# Patient Record
Sex: Female | Born: 1995 | Race: White | Hispanic: No | Marital: Married | Smoking: Never smoker
Health system: Southern US, Community
[De-identification: ages and names within clinical notes are randomized; demographics above are authoritative.]

## PROBLEM LIST (undated history)

## (undated) DIAGNOSIS — Z789 Other specified health status: Secondary | ICD-10-CM

---
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
## 2014-07-05 ENCOUNTER — Emergency Department
Admit: 2014-07-05 | Disposition: A | Discharge status: Home / Self Care | Payer: Self-pay | Admitting: Physician Assistant

## 2014-10-31 ENCOUNTER — Other Ambulatory Visit: Payer: Self-pay | Admitting: Nurse Practitioner

## 2014-10-31 DIAGNOSIS — S5402XA Injury of ulnar nerve at forearm level, left arm, initial encounter: Secondary | ICD-10-CM

## 2014-11-05 ENCOUNTER — Ambulatory Visit: Payer: Self-pay

## 2014-11-08 ENCOUNTER — Ambulatory Visit
Admission: RE | Admit: 2014-11-08 | Discharge: 2014-11-08 | Disposition: A | Discharge status: Home / Self Care | Payer: BLUE CROSS/BLUE SHIELD | Source: Ambulatory Visit | Attending: Nurse Practitioner | Admitting: Nurse Practitioner

## 2014-11-08 DIAGNOSIS — R202 Paresthesia of skin: Secondary | ICD-10-CM | POA: Insufficient documentation

## 2014-11-08 DIAGNOSIS — M25522 Pain in left elbow: Secondary | ICD-10-CM | POA: Insufficient documentation

## 2014-11-08 DIAGNOSIS — S5402XA Injury of ulnar nerve at forearm level, left arm, initial encounter: Secondary | ICD-10-CM

## 2015-05-29 ENCOUNTER — Ambulatory Visit
Admission: RE | Admit: 2015-05-29 | Discharge: 2015-05-29 | Disposition: A | Discharge status: Home / Self Care | Payer: BLUE CROSS/BLUE SHIELD | Source: Ambulatory Visit | Attending: Nurse Practitioner | Admitting: Nurse Practitioner

## 2015-05-29 ENCOUNTER — Other Ambulatory Visit: Payer: Self-pay | Admitting: Nurse Practitioner

## 2015-05-29 DIAGNOSIS — R918 Other nonspecific abnormal finding of lung field: Secondary | ICD-10-CM | POA: Diagnosis not present

## 2015-05-29 DIAGNOSIS — R05 Cough: Secondary | ICD-10-CM

## 2015-05-29 DIAGNOSIS — R059 Cough, unspecified: Secondary | ICD-10-CM

## 2016-01-26 ENCOUNTER — Ambulatory Visit
Admission: EM | Admit: 2016-01-26 | Discharge: 2016-01-26 | Disposition: A | Discharge status: Home / Self Care | Payer: BLUE CROSS/BLUE SHIELD | Attending: Family Medicine | Admitting: Family Medicine

## 2016-01-26 ENCOUNTER — Encounter: Payer: Self-pay | Admitting: *Deleted

## 2016-01-26 DIAGNOSIS — R509 Fever, unspecified: Secondary | ICD-10-CM

## 2016-01-26 DIAGNOSIS — B2790 Infectious mononucleosis, unspecified without complication: Secondary | ICD-10-CM | POA: Diagnosis not present

## 2016-01-26 DIAGNOSIS — D72829 Elevated white blood cell count, unspecified: Secondary | ICD-10-CM

## 2016-01-26 LAB — CBC WITH DIFFERENTIAL/PLATELET
BASOS ABS: 0 10*3/uL (ref 0–0.1)
Basophils Relative: 0 %
EOS ABS: 0 10*3/uL (ref 0–0.7)
Eosinophils Relative: 0 %
HCT: 39 % (ref 35.0–47.0)
Hemoglobin: 13.1 g/dL (ref 12.0–16.0)
LYMPHS ABS: 15.4 10*3/uL — AB (ref 1.0–3.6)
Lymphocytes Relative: 72 %
MCH: 29.3 pg (ref 26.0–34.0)
MCHC: 33.6 g/dL (ref 32.0–36.0)
MCV: 87.3 fL (ref 80.0–100.0)
MONO ABS: 0.9 10*3/uL (ref 0.2–0.9)
MONOS PCT: 4 %
Neutro Abs: 5.1 10*3/uL (ref 1.4–6.5)
Neutrophils Relative %: 24 %
PLATELETS: 183 10*3/uL (ref 150–440)
RBC: 4.46 MIL/uL (ref 3.80–5.20)
RDW: 13.2 % (ref 11.5–14.5)
WBC: 21.4 10*3/uL — AB (ref 3.6–11.0)

## 2016-01-26 LAB — BASIC METABOLIC PANEL
ANION GAP: 12 (ref 5–15)
BUN: 9 mg/dL (ref 6–20)
CO2: 22 mmol/L (ref 22–32)
Calcium: 8.9 mg/dL (ref 8.9–10.3)
Chloride: 98 mmol/L — ABNORMAL LOW (ref 101–111)
Creatinine, Ser: 0.84 mg/dL (ref 0.44–1.00)
Glucose, Bld: 134 mg/dL — ABNORMAL HIGH (ref 65–99)
POTASSIUM: 3.5 mmol/L (ref 3.5–5.1)
SODIUM: 132 mmol/L — AB (ref 135–145)

## 2016-01-26 LAB — MONONUCLEOSIS SCREEN: MONO SCREEN: POSITIVE — AB

## 2016-01-26 LAB — RAPID STREP SCREEN (MED CTR MEBANE ONLY): Streptococcus, Group A Screen (Direct): NEGATIVE

## 2016-01-26 MED ORDER — DEXAMETHASONE SODIUM PHOSPHATE 10 MG/ML IJ SOLN
10.0000 mg | Freq: Once | INTRAMUSCULAR | Status: AC
  Administered 2016-01-26: 10 mg via INTRAMUSCULAR

## 2016-01-26 MED ORDER — ACETAMINOPHEN 160 MG/5ML PO SOLN
960.0000 mg | Freq: Once | ORAL | Status: AC
  Administered 2016-01-26: 960 mg via ORAL

## 2016-01-26 NOTE — ED Notes (Signed)
Pt refused strep screen

## 2016-01-26 NOTE — ED Triage Notes (Signed)
Sore throat, fever, runny nose x1 week. Dysphagia due to pain and throat edema. Mother states fever as high as 104 today. Seen by PCP Thursday and tested negeative for strep but was tx for it. Pt could not swallow rx pill so was switched to liquid Friday. Pt not improving.

## 2016-01-26 NOTE — Discharge Instructions (Signed)
Rest. Drink plenty of fluids. Take over-the-counter Tylenol or ibuprofen as needed for control pain and fever.  Follow up with your primary care physician tomorrow. Return to Urgent care or ER for new or worsening concerns.

## 2016-01-26 NOTE — ED Provider Notes (Signed)
[facility] ____________________________________________  Time seen: Approximately 9:22 PM  I have reviewed the triage vital signs and the nursing notes.   HISTORY  Chief Complaint Sore Throat; Fever; Nasal Congestion; and Otalgia   HPI Joy Barrett is a 20 y.o. female presents with mother at bedside for the complaints of sore throat and fever. Patient mother reports that last Wednesday and Thursday patient began having sore throat and mild fevers. Reports that patient was seen by her primary care physician and started on oral penicillin. Reports unable to tolerate pills and then called PCP back on Friday and was switched to liquid penicillin. Mother states that initially she was given half the dose by mistake for 1.5 days, but reports has been given full dose of antibiotics for the last 1.5 days. Reports has continued with sore throat, tonsillar swelling and intermittent fevers. Patient states that also today her throat no longer hurts but continues with swelling in her throat. Denies sore throat at this time. Reports has not tried to take any over-the-counter Tylenol or ibuprofen since this morning. Has not tried to take liquid antipyretics at home. Reports has been taken home antibiotics.   Patient reports multiple sick contacts at school including recent strep contacts. Denies recent sickness. Denies recent history of mono. Denies any accompanying nasal congestion, runny nose, cough, chest pain, shortness breath, abdominal pain, dysuria, neck pain, back pain, or rash.   Patient's last menstrual period was 12/29/2015 (approximate). Denies pregnancy.    History reviewed. No pertinent past medical history.  There are no active problems to display for this patient.   History reviewed. No pertinent surgical history.  Current Outpatient Rx  . Order #: [id number] Class: Historical Med    No current facility-administered medications for this encounter.   Current  Outpatient Prescriptions:  .  penicillin v potassium (VEETID) 250 MG/5ML solution, Take by mouth 4 (four) times daily., Disp: , Rfl:   Allergies Albuterol  History reviewed. No pertinent family history.  Social History Social History  Substance Use Topics  . Smoking status: Never Smoker  . Smokeless tobacco: Never Used  . Alcohol use No    Review of Systems Constitutional: Positive fever. Eyes: No visual changes. ENT: Positive sore throat. Cardiovascular: Denies chest pain. Respiratory: Denies shortness of breath. Gastrointestinal: No abdominal pain.  No nausea, no vomiting.  No diarrhea.  No constipation. Genitourinary: Negative for dysuria. Musculoskeletal: Negative for back pain. Skin: Negative for rash. Neurological: Negative for headaches, focal weakness or numbness.  10-point ROS otherwise negative.  ____________________________________________   PHYSICAL EXAM:  VITAL SIGNS: ED Triage Vitals  Enc Vitals Group     BP 01/26/16 1856 115/60     Pulse Rate 01/26/16 1856 (!) 132     Resp 01/26/16 1856 20     Temp 01/26/16 1856 (!) 103 F (39.4 C)     Temp Source 01/26/16 1856 Oral     SpO2 01/26/16 1856 98 %     Weight --      Height 01/26/16 1900 5\' 3"  (1.6 m)     Head Circumference --      Peak Flow --      Pain Score --      Pain Loc --      Pain Edu? --      Excl. in GC? --    Today's Vitals   01/26/16 1856 01/26/16 1900 01/26/16 2018  BP: 115/60  110/66  Pulse: (!) 132  (!) 124  Resp: 20  16  Temp: (!) 103 F (39.4 C)  99.6 F (37.6 C)  TempSrc: Oral    SpO2: 98%  98%  Height:  5\' 3"  (1.6 m)    Constitutional: Alert and oriented. Well appearing and in no acute distress. Eyes: Conjunctivae are normal. PERRL. EOMI. Head: Atraumatic. No sinus tenderness to palpation. No swelling. No erythema.  Ears: no erythema, normal TMs bilaterally.   Nose:No nasal congestion or rhinorrhea.   Mouth/Throat: Mucous membranes are moist. Moderate pharyngeal  erythema with 4+ bilateral tonsillar swelling and exudate. Unable to visualize if uvular shift is present. No obvious unilateral abscess. Neck: No stridor.  No cervical spine tenderness to palpation. Hematological/Lymphatic/Immunilogical: bilateral anterior cervical lymphadenopathy. Cardiovascular: Normal rate, regular rhythm. Grossly normal heart sounds.  Good peripheral circulation. Respiratory: Normal respiratory effort.  No retractions. Lungs CTAB.No wheezes, rales or rhonchi. Good air movement.  Gastrointestinal: Soft and nontender. Normal Bowel sounds. No CVA tenderness. No hepatosplenomegaly palpated. Musculoskeletal: No lower or upper extremity tenderness nor edema. No cervical, thoracic or lumbar tenderness to palpation. Neurologic:  Normal speech and language. No gross focal neurologic deficits are appreciated. No gait instability. Skin:  Skin is warm, dry and intact. No rash noted. Psychiatric: Mood and affect are normal. Speech and behavior are normal.  ___________________________________________   LABS (all labs ordered are listed, but only abnormal results are displayed)  Labs Reviewed  CBC WITH DIFFERENTIAL/PLATELET - Abnormal; Notable for the following:       Result Value   WBC 21.4 (*)    Lymphs Abs 15.4 (*)    All other components within normal limits  BASIC METABOLIC PANEL - Abnormal; Notable for the following:    Sodium 132 (*)    Chloride 98 (*)    Glucose, Bld 134 (*)    All other components within normal limits  MONONUCLEOSIS SCREEN - Abnormal; Notable for the following:    Mono Screen POSITIVE (*)    All other components within normal limits  RAPID STREP SCREEN (NOT AT [facility])  CULTURE, GROUP A STREP [facility]    RADIOLOGY  No results found. ____________________________________________   PROCEDURES Procedures    INITIAL IMPRESSION / ASSESSMENT AND PLAN / ED COURSE  Pertinent labs & imaging results that were available during my care of the patient were  reviewed by me and considered in my medical decision making (see chart for details).  Patient presenting with mother at bedside for sore throat and fever. Patient is noted to have 4+ out of tonsillar swelling and bilateral exudate. Unable to determine if uvular shift present or not. Patient able to tolerate fluids in room. 1 g Tylenol orally given once. Discussed with patient evaluation of strep, mono, CBC and WBC. Discussed in detail with patient and mother concerning regarding tonsillar abscess possibility and based on nonimprovement with oral antibiotics with continued fever, recommend patient be seen in further evaluated emergency room. Patient mother states he won't have laboratory studies performed here.  Patient after Decadron 10 mg IM as well as Tylenol, patient reports feeling much better injured and fluids better in urgent care. Fever improved, remained tachycardic. Patient appears stable. Discussed in detail laboratory results. Patient with marked elevation of WBC of 21.4 and positive for mono. Patient initially refused strep, but then did agree and quick strep negative, will culture. As Mono positive and strep negative with no clear improvement with oral antibiotics at home, will stop oral antibiotics at this time. In discussing with patient and mother recommended for further evaluation and  exclusion of tonsillar abscess in ER, patient mother refused. Patient mother verbalized understanding of risks including up to death of airway extraction, and states that they will go home with strict follow-up and return parameters. Discussed in detail with patient and mother to follow-up with primary care physician tomorrow for follow-up and monitoring of WBC count. Discussed no contact sports for at least 4 weeks. Encouraged supportive care and should follow-up and return parameters to urgent care or ER.  Discussed follow up with Primary care physician this week. Discussed follow up and return parameters  including no resolution or any worsening concerns. Patient verbalized understanding and agreed to plan.   ____________________________________________   FINAL CLINICAL IMPRESSION(S) / ED DIAGNOSES  Final diagnoses:  Mononucleosis  Leukocytosis, unspecified type  Fever, unspecified fever cause     Discharge Medication List as of 01/26/2016  8:58 PM      Note: This dictation was prepared with Dragon dictation along with smaller phrase technology. Any transcriptional errors that result from this process are unintentional.    Clinical Course       Renford Dills, NP 01/26/16 2133

## 2016-01-29 LAB — CULTURE, GROUP A STREP (THRC)

## 2016-01-30 ENCOUNTER — Telehealth: Payer: Self-pay | Admitting: Emergency Medicine

## 2016-01-30 MED ORDER — AZITHROMYCIN 200 MG/5ML PO SUSR: ORAL | 0 refills | Status: DC

## 2016-01-30 NOTE — Telephone Encounter (Signed)
Mother called stating that her daughter was no longer taking Penicillin because her daughter was diagnosed with Mono during her visit on 11/3.  Mother wanted to know if her daughter should be put on another antibiotic given her throat culture result.  Mother states that her daughter had 5 days of Penicillin prior to her visit on 11/13.  Spoke to ArvinMeritorBill Roemer, [place] who reviewed patient's throat culture result and feels that she was treated adequately for a subgroup of Strep for 5 days and that no other antibiotic is needed at this time.  Tried calling patient and mother- no answer and left message for them to give us a call back.

## 2016-01-30 NOTE — Telephone Encounter (Signed)
Patient notified of her throat culture result and to continue with her Penicillin.  Patient verbalized understanding.

## 2016-01-30 NOTE — Telephone Encounter (Signed)
Spoke to patient's mother who states that her daughter is still having a sore throat and has only had a little improvement since her visit on 11/13 and has [facility] after her visit on 11/13 for Decadron injections and mother also states that her daughter was placed on Prednisone to take at home.  I spoke to Dr. Thurmond ButtsWade who was informed of patient's ongoing symptoms and after reviewing patient's chart recommended Azithromycin 200mg /75ml 2 1/2 tsp once a day for 3 days no refills sent to patient's pharmacy.  Mother was notified that the prescription would be sent to [facility] in [place].  Mother was also instructed to monitor her daughter closely to make sure that she is taking in enough fluids and urinating and is not having any difficulty with swallowing.  Mother was advised that is she was having any difficulties with these or if her symptoms worsen that she would need to go immediately to the ER.  Mother was also informed that her daughter would need to follow-up with her PCP in 2 weeks to be retested for strep and to have her WBC rechecked. Mother verbalized understanding.  Mother states that her daughter has [facility] on Tuesday, [facility].  Mother to call us anytime if she has any questions or concerns.

## 2016-02-01 ENCOUNTER — Telehealth: Payer: Self-pay | Admitting: Emergency Medicine

## 2016-02-01 NOTE — Telephone Encounter (Signed)
Tried calling patient- no answer.  Left message for patient to call us back if her symptoms were not improving or worsening.

## 2016-09-24 IMAGING — CR DG CHEST 2V
1 series · 2 of 2 positions shown · non-contrast
Comparison: None.

CLINICAL DATA: Nonproductive cough for 3 weeks.

EXAM:
CHEST  2 VIEW

[Series 1: dg chest 2 view · 0.14mm/px · 2 of 2 slices shown]
[im 1/2]
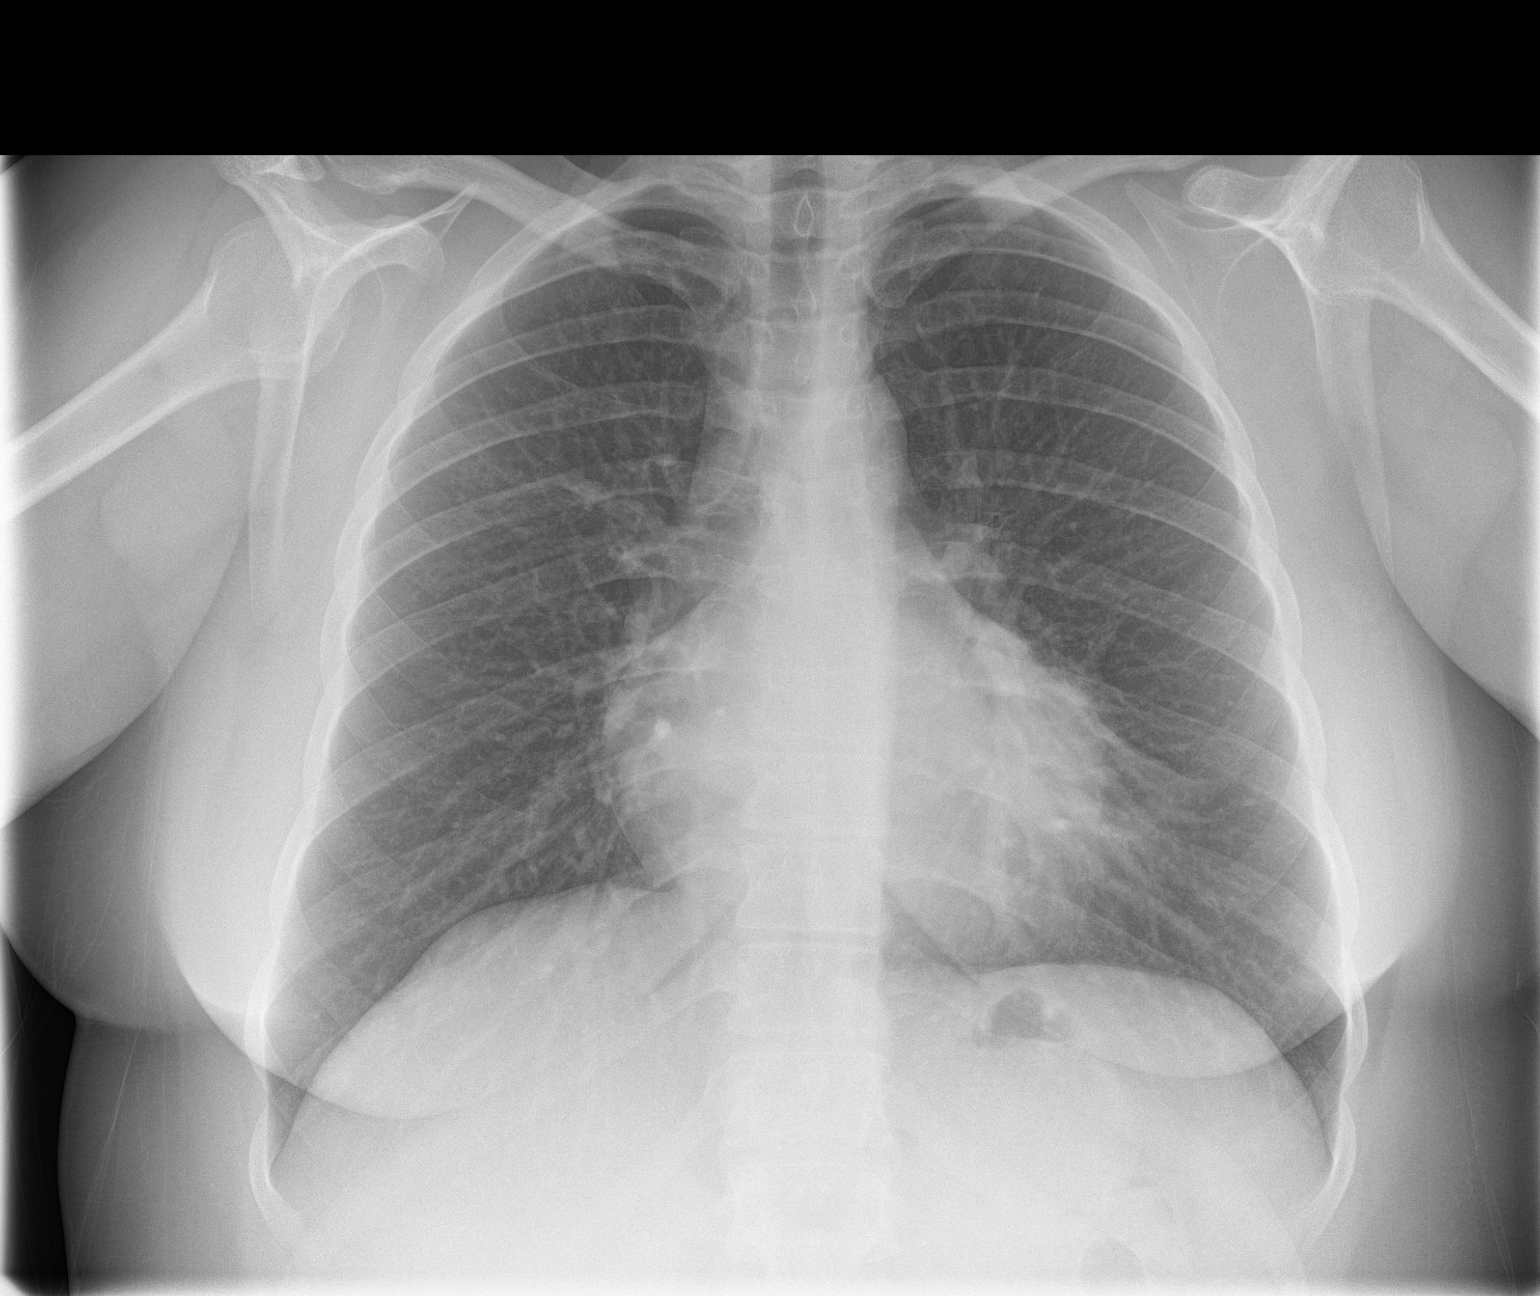
[im 2/2]
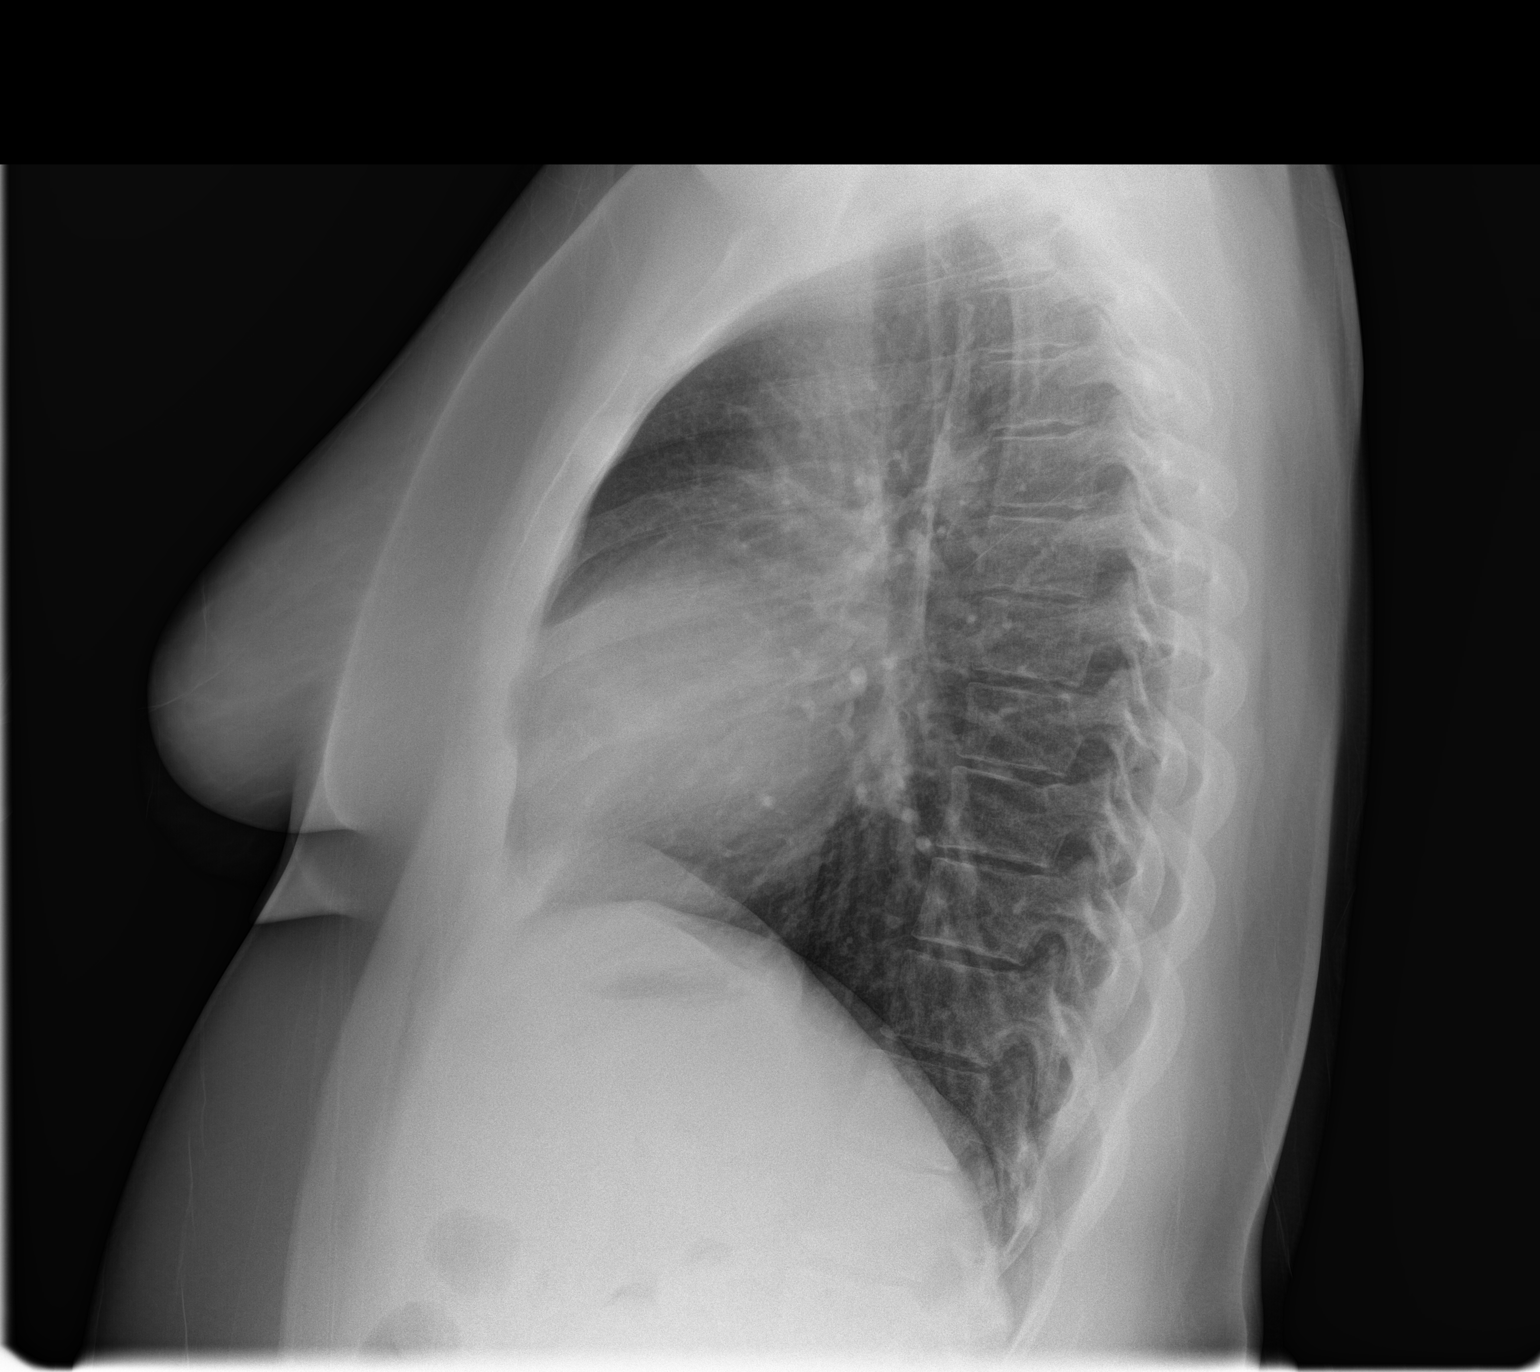

[2 of 2 positions shown; findings below may reference images not displayed]

FINDINGS: Ill-defined airspace opacity overlies the left heart border, highly
suspicious for a lingular pneumonia. Lungs otherwise clear. Heart
size is normal.
IMPRESSION: Probable lingular pneumonia.

## 2017-04-29 ENCOUNTER — Ambulatory Visit
Admission: EM | Admit: 2017-04-29 | Discharge: 2017-04-29 | Disposition: A | Discharge status: Home / Self Care | Payer: BLUE CROSS/BLUE SHIELD | Attending: Emergency Medicine | Admitting: Emergency Medicine

## 2017-04-29 ENCOUNTER — Encounter: Payer: Self-pay | Admitting: Emergency Medicine

## 2017-04-29 ENCOUNTER — Other Ambulatory Visit: Payer: Self-pay

## 2017-04-29 DIAGNOSIS — J0100 Acute maxillary sinusitis, unspecified: Secondary | ICD-10-CM | POA: Diagnosis not present

## 2017-04-29 HISTORY — DX: Other specified health status: Z78.9

## 2017-04-29 LAB — RAPID INFLUENZA A&B ANTIGENS (ARMC ONLY)
INFLUENZA A (ARMC): NEGATIVE
INFLUENZA B (ARMC): NEGATIVE

## 2017-04-29 MED ORDER — FLUTICASONE PROPIONATE 50 MCG/ACT NA SUSP: 2.0000 | Freq: Every day | NASAL | 0 refills | Status: DC

## 2017-04-29 MED ORDER — AMOXICILLIN-POT CLAVULANATE 875-125 MG PO TABS: 1.0000 | ORAL_TABLET | Freq: Two times a day (BID) | ORAL | 0 refills | Status: DC

## 2017-04-29 MED ORDER — IBUPROFEN 600 MG PO TABS: 600.0000 mg | ORAL_TABLET | Freq: Four times a day (QID) | ORAL | 0 refills | Status: DC | PRN

## 2017-04-29 NOTE — ED Triage Notes (Signed)
Patient in today c/o weak, dizziness and sweating 2 days ago. Now with nasal congestion, runny nose and headache.

## 2017-04-29 NOTE — Discharge Instructions (Signed)
Take the medication as written. Start Mucinex-D to keep the mucous thin and to decongest you.  You may take 600 mg of motrin with 1 gram of tylenol up to 3-4 times a day as needed for pain. This is an effective combination for pain.  Most sinus infections are viral and do not need antibiotics unless you have a high fever, have had this for 10 days, or you get better and then get sick again. Use a NeilMed sinus rinse as often as you want to to reduce nasal congestion. Follow the directions on the box.   Go to www.goodrx.com to look up your medications. This will give you a list of where you can find your prescriptions at the most affordable prices. Or you can ask the pharmacist what the cash price is. This is frequently cheaper than going through insurance.   

## 2017-04-29 NOTE — ED Provider Notes (Signed)
HPI  SUBJECTIVE:  Joy Barrett is a 22 y.o. female who presents with greenish then clear nasal congestion, rhinorrhea, postnasal drip, diffuse headache starting last night.  She reports sinus pain and pressure, bilateral ear pressure.  She states that she was dizzy with nausea and sweats 2 days ago, but this has since resolved.  She denies any documented fevers but states that she does not "trust her thermometer".  She reports body aches, headaches, and allergy symptoms.  No upper dental pain, facial swelling, coughing, wheezing, chest pain, shortness of breath.  She tried Sudafed, Airborne, Goody's powder.  The Goody's helped with her headache.  No aggravating factors.  She was treated for pneumonia last month with azithromycin.  She has not taken any antipyretics in the past 6-8 hours.  She got a flu shot this year.  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  No history of diabetes, hypertension.  LMP: Now.  PMD: Dr. Carter KittenHeather Bustia    Past Medical History:  Diagnosis Date  . No known health problems     History reviewed. No pertinent surgical history.  Family History  Problem Relation Age of Onset  . Rheum arthritis Mother   . Colon polyps Father     Social History   Tobacco Use  . Smoking status: Never Smoker  . Smokeless tobacco: Never Used  Substance Use Topics  . Alcohol use: No  . Drug use: No    No current facility-administered medications for this encounter.   Current Outpatient Medications:  .  amoxicillin-clavulanate (AUGMENTIN) 875-125 MG tablet, Take 1 tablet by mouth 2 (two) times daily. X 7 days, Disp: 14 tablet, Rfl: 0 .  fluticasone (FLONASE) 50 MCG/ACT nasal spray, Place 2 sprays into both nostrils daily., Disp: 16 g, Rfl: 0 .  ibuprofen (ADVIL,MOTRIN) 600 MG tablet, Take 1 tablet (600 mg total) by mouth every 6 (six) hours as needed., Disp: 30 tablet, Rfl: 0  Allergies  Allergen Reactions  . Albuterol  Hives     ROS  As noted in HPI.   Physical Exam  BP (!) 97/57 (BP Location: Left Arm)   Pulse 89   Temp 98.7 F (37.1 C) (Oral)   Resp 16   Ht 5\' 2"  (1.575 m)   Wt 180 lb (81.6 kg)   LMP 04/29/2017 (Exact Date)   SpO2 100%   BMI 32.92 kg/m   Constitutional: Well developed, well nourished, no acute distress Eyes:  EOMI, conjunctiva normal bilaterally HENT: Normocephalic, atraumatic,mucus membranes moist.  TMs normal bilaterally.  Erythematous, swollen turbinates.  Clear nasal congestion.  Mild maxillary sinus tenderness.  No frontal sinus tenderness.  Normal oropharynx.  Positive postnasal drip with cobblestoning. Respiratory: Normal inspiratory effort Cardiovascular: Normal rate GI: nondistended skin: No rash, skin intact Musculoskeletal: no deformities Neurologic: Alert & oriented x 3, no focal neuro deficits Psychiatric: Speech and behavior appropriate   ED Course   Medications - No data to display  Orders Placed This Encounter  Procedures  . Rapid Influenza A&B Antigens ([facility] only)    Standing Status:   Standing    Number of Occurrences:   1  . Droplet precaution    Standing Status:   Standing    Number of Occurrences:   1    Results for orders placed or performed during the hospital encounter of 04/29/17 (from the past 24 hour(s))  Rapid Influenza A&B Antigens ([facility] only)     Status: None  Collection Time: 04/29/17  2:18 PM  Result Value Ref Range   Influenza A ([facility]) NEGATIVE NEGATIVE   Influenza B ([facility]) NEGATIVE NEGATIVE   No results found.  ED Clinical Impression  Acute non-recurrent maxillary sinusitis   ED Assessment/Plan  Will check for flu although her presentation is more consistent with a sinusitis, most likely viral.  Discussed this with patient and parent.  Will send home with ibuprofen 600 mg to take with 1 g of Tylenol 3-4 times a day as needed for pain, Flonase, saline nasal irrigation, Mucinex D.  Will send home with a wait-and-see  prescription of Augmentin, but advised him to not fill it until she has been sick for 10 days or if she starts having fevers above 102.  She agrees with plan.  Flu negative.  Plan as above.  Meds ordered this encounter  Medications  . fluticasone (FLONASE) 50 MCG/ACT nasal spray    Sig: Place 2 sprays into both nostrils daily.    Dispense:  16 g    Refill:  0  . amoxicillin-clavulanate (AUGMENTIN) 875-125 MG tablet    Sig: Take 1 tablet by mouth 2 (two) times daily. X 7 days    Dispense:  14 tablet    Refill:  0  . ibuprofen (ADVIL,MOTRIN) 600 MG tablet    Sig: Take 1 tablet (600 mg total) by mouth every 6 (six) hours as needed.    Dispense:  30 tablet    Refill:  0    *This clinic note was created using Scientist, clinical (histocompatibility and immunogenetics). Therefore, there may be occasional mistakes despite careful proofreading.   ?   Domenick Gong, MD 04/29/17 [phone]

## 2017-06-27 ENCOUNTER — Encounter: Payer: Self-pay | Admitting: Nurse Practitioner

## 2017-06-27 ENCOUNTER — Ambulatory Visit: Payer: BLUE CROSS/BLUE SHIELD | Admitting: Nurse Practitioner

## 2017-06-27 VITALS — BP 123/59 | HR 88 | Resp 16 | Ht 62.5 in | Wt 223.0 lb

## 2017-06-27 DIAGNOSIS — K580 Irritable bowel syndrome with diarrhea: Secondary | ICD-10-CM

## 2017-06-27 DIAGNOSIS — F321 Major depressive disorder, single episode, moderate: Secondary | ICD-10-CM

## 2017-06-27 MED ORDER — HYOSCYAMINE SULFATE 0.125 MG PO TABS: 0.2500 mg | ORAL_TABLET | ORAL | 2 refills | Status: AC | PRN

## 2017-06-27 MED ORDER — IMIPRAMINE HCL 10 MG PO TABS: 10.0000 mg | ORAL_TABLET | Freq: Every day | ORAL | 3 refills | Status: DC

## 2017-06-27 NOTE — Progress Notes (Signed)
[facility] [address] [place], [place] [address]  Internal MEDICINE  Office Visit Note  Patient Name: Joy Barrett  [id number]  [id number]  Date of Service: 07/17/2017   Pt is here for routine follow up.   Chief Complaint  Patient presents with  . Irritable Bowel Syndrome    diarrhea    The patient is complaining of irritable bowel syndrome. She has frequent cramping and episodes of diarrhea. Become more frequent and less predictable while stressed. Previously prescribed Bentyl is not really helping. She has had a great deal of increased anxiety since her parent have separated. She would like to adopt a cat to help her with her anxiety and depression.       Current Medication: Outpatient Encounter Medications as of 06/27/2017  Medication Sig  . cetirizine (ZYRTEC) 10 MG tablet Take 10 mg by mouth daily.  . Norgestimate-Ethinyl Estradiol Triphasic (TRI-LO-SPRINTEC) 0.18/0.215/0.25 MG-25 MCG tab Take 1 tablet by mouth daily.  . hyoscyamine (LEVSIN, ANASPAZ) 0.125 MG tablet Take 2 tablets (0.25 mg total) by mouth every 4 (four) hours as needed.  Marland Kitchen imipramine (TOFRANIL) 10 MG tablet Take 1 tablet (10 mg total) by mouth at bedtime.  . [DISCONTINUED] amoxicillin-clavulanate (AUGMENTIN) 875-125 MG tablet Take 1 tablet by mouth 2 (two) times daily. X 7 days  . [DISCONTINUED] fluticasone (FLONASE) 50 MCG/ACT nasal spray Place 2 sprays into both nostrils daily.  . [DISCONTINUED] ibuprofen (ADVIL,MOTRIN) 600 MG tablet Take 1 tablet (600 mg total) by mouth every 6 (six) hours as needed.   No facility-administered encounter medications on file as of 06/27/2017.     Surgical History: No past surgical history on file.  Medical History: Past Medical History:  Diagnosis Date  . No known health problems     Family History: Family History  Problem Relation Age of Onset  . Rheum arthritis Mother   . Colon polyps Father     Social History   Socioeconomic History  .  Marital status: Single    Spouse name: Not on file  . Number of children: Not on file  . Years of education: Not on file  . Highest education level: Not on file  Occupational History  . Not on file  Social Needs  . Financial resource strain: Not on file  . Food insecurity:    Worry: Not on file    Inability: Not on file  . Transportation needs:    Medical: Not on file    Non-medical: Not on file  Tobacco Use  . Smoking status: Never Smoker  . Smokeless tobacco: Never Used  Substance and Sexual Activity  . Alcohol use: No  . Drug use: No  . Sexual activity: Not on file  Lifestyle  . Physical activity:    Days per week: Not on file    Minutes per session: Not on file  . Stress: Not on file  Relationships  . Social connections:    Talks on phone: Not on file    Gets together: Not on file    Attends religious service: Not on file    Active member of club or organization: Not on file    Attends meetings of clubs or organizations: Not on file    Relationship status: Not on file  . Intimate partner violence:    Fear of current or ex partner: Not on file    Emotionally abused: Not on file    Physically abused: Not on file    Forced sexual activity:  Not on file  Other Topics Concern  . Not on file  Social History Narrative  . Not on file      Review of Systems  Constitutional: Negative for activity change, chills, fatigue and unexpected weight change.  HENT: Negative for congestion, postnasal drip, rhinorrhea, sneezing and sore throat.   Eyes: Negative.  Negative for redness.  Respiratory: Negative for cough, chest tightness and shortness of breath.   Cardiovascular: Negative for chest pain and palpitations.  Gastrointestinal: Positive for abdominal pain, diarrhea and nausea. Negative for constipation and vomiting.  Endocrine: Negative for cold intolerance, heat intolerance, polydipsia, polyphagia and polyuria.  Genitourinary: Negative for dysuria and frequency.   Musculoskeletal: Negative for arthralgias, back pain, joint swelling and neck pain.  Skin: Negative for rash.  Allergic/Immunologic: Negative for environmental allergies.  Neurological: Negative.  Negative for tremors, numbness and headaches.  Hematological: Negative for adenopathy. Does not bruise/bleed easily.  Psychiatric/Behavioral: Positive for dysphoric mood and sleep disturbance. Negative for behavioral problems (Depression) and suicidal ideas. The patient is nervous/anxious.     Today's Vitals   06/27/17 1521  BP: (!) 123/59  Pulse: 88  Resp: 16  SpO2: 100%  Weight: 223 lb (101.2 kg)  Height: 5' 2.5" (1.588 m)    Physical Exam  Constitutional: She is oriented to person, place, and time. She appears well-developed and well-nourished. No distress.  HENT:  Head: Normocephalic and atraumatic.  Nose: Nose normal.  Mouth/Throat: Oropharynx is clear and moist. No oropharyngeal exudate.  Eyes: Pupils are equal, round, and reactive to light. Conjunctivae and EOM are normal.  Neck: Normal range of motion. Neck supple. No JVD present. No tracheal deviation present. No thyromegaly present.  Cardiovascular: Normal rate, regular rhythm and normal heart sounds. Exam reveals no gallop and no friction rub.  No murmur heard. Pulmonary/Chest: Effort normal and breath sounds normal. No respiratory distress. She has no wheezes. She has no rales. She exhibits no tenderness.  Abdominal: Soft. Bowel sounds are normal. There is tenderness.  Musculoskeletal: Normal range of motion.  Lymphadenopathy:    She has no cervical adenopathy.  Neurological: She is alert and oriented to person, place, and time. No cranial nerve deficit.  Skin: Skin is warm and dry. She is not diaphoretic.  Psychiatric: She has a normal mood and affect. Her behavior is normal. Judgment and thought content normal.  Nursing note and vitals reviewed.  Assessment/Plan: 1. Irritable bowel syndrome with diarrhea Start  imipramine 10mg  every evening. Take levsin 0.125mg  every 4 hours as needed for intestinal cramping/diarrheaa. May take 2 tablets as needed.  - hyoscyamine (LEVSIN, ANASPAZ) 0.125 MG tablet; Take 2 tablets (0.25 mg total) by mouth every 4 (four) hours as needed.  Dispense: 120 tablet; Refill: 2 - imipramine (TOFRANIL) 10 MG tablet; Take 1 tablet (10 mg total) by mouth at bedtime.  Dispense: 30 tablet; Refill: 3  2. Episode of moderate major depression (HCC) Start imiprimine 10mg  tablets every evening. May increase to 2 tablets. Will provide a letter for patient's  Landlord, allowing her to adopt a kitten in order to help with physical and emotional stress.    General Counseling: Jemina verbalizes understanding of the findings of todays visit and agrees with plan of treatment. I have discussed any further diagnostic evaluation that may be needed or ordered today. We also reviewed her medications today. she has been encouraged to call the office with any questions or concerns that should arise related to todays visit.  This patient was seen by Herbert Seta  Hermenia FiscalBoscia, FNP- C in Collaboration with Dr Lyndon CodeFozia M Khan as a part of collaborative care agreement    Meds ordered this encounter  Medications  . hyoscyamine (LEVSIN, ANASPAZ) 0.125 MG tablet    Sig: Take 2 tablets (0.25 mg total) by mouth every 4 (four) hours as needed.    Dispense:  120 tablet    Refill:  2    Order Specific Question:   Supervising Provider    Answer:   Lyndon CodeKHAN, FOZIA M [[id number]]  . imipramine (TOFRANIL) 10 MG tablet    Sig: Take 1 tablet (10 mg total) by mouth at bedtime.    Dispense:  30 tablet    Refill:  3    Order Specific Question:   Supervising Provider    Answer:   Lyndon CodeKHAN, FOZIA M [[id number]]    Time spent: 6625 Minutes      Dr Lyndon CodeFozia M Khan Internal medicine

## 2017-07-06 ENCOUNTER — Other Ambulatory Visit: Payer: Self-pay

## 2017-07-06 ENCOUNTER — Telehealth: Payer: Self-pay

## 2017-07-06 DIAGNOSIS — K580 Irritable bowel syndrome with diarrhea: Secondary | ICD-10-CM

## 2017-07-06 NOTE — Telephone Encounter (Signed)
Left message advising patient that her letter is ready for pickup.titania

## 2017-07-07 ENCOUNTER — Telehealth: Payer: Self-pay

## 2017-07-07 ENCOUNTER — Other Ambulatory Visit: Payer: Self-pay

## 2017-07-07 NOTE — Telephone Encounter (Signed)
lmom to call back 

## 2017-07-17 DIAGNOSIS — F321 Major depressive disorder, single episode, moderate: Secondary | ICD-10-CM | POA: Insufficient documentation

## 2017-07-17 DIAGNOSIS — K580 Irritable bowel syndrome with diarrhea: Secondary | ICD-10-CM | POA: Insufficient documentation

## 2017-07-22 ENCOUNTER — Other Ambulatory Visit: Payer: Self-pay

## 2017-07-22 ENCOUNTER — Telehealth: Payer: Self-pay

## 2017-07-22 DIAGNOSIS — K580 Irritable bowel syndrome with diarrhea: Secondary | ICD-10-CM

## 2017-07-22 MED ORDER — IMIPRAMINE HCL 10 MG PO TABS: 10.0000 mg | ORAL_TABLET | Freq: Every day | ORAL | 1 refills | Status: DC

## 2017-07-22 MED ORDER — IMIPRAMINE HCL 10 MG PO TABS: 10.0000 mg | ORAL_TABLET | Freq: Every day | ORAL | 1 refills | Status: AC

## 2017-07-22 NOTE — Telephone Encounter (Signed)
error 

## 2017-08-29 ENCOUNTER — Ambulatory Visit: Payer: Self-pay | Admitting: Nurse Practitioner

## 2024-03-30 ENCOUNTER — Ambulatory Visit: Admitting: Nurse Practitioner

## 2024-03-30 ENCOUNTER — Encounter: Payer: Self-pay | Admitting: Nurse Practitioner

## 2024-03-30 ENCOUNTER — Other Ambulatory Visit: Payer: Self-pay | Admitting: Nurse Practitioner

## 2024-03-30 VITALS — BP 130/88 | HR 100 | Temp 97.7°F | Resp 16 | Ht 63.0 in | Wt 269.2 lb

## 2024-03-30 DIAGNOSIS — N912 Amenorrhea, unspecified: Secondary | ICD-10-CM | POA: Insufficient documentation

## 2024-03-30 DIAGNOSIS — E538 Deficiency of other specified B group vitamins: Secondary | ICD-10-CM

## 2024-03-30 DIAGNOSIS — Z6841 Body Mass Index (BMI) 40.0 and over, adult: Secondary | ICD-10-CM | POA: Diagnosis not present

## 2024-03-30 DIAGNOSIS — E559 Vitamin D deficiency, unspecified: Secondary | ICD-10-CM

## 2024-03-30 DIAGNOSIS — Z862 Personal history of diseases of the blood and blood-forming organs and certain disorders involving the immune mechanism: Secondary | ICD-10-CM | POA: Insufficient documentation

## 2024-03-30 DIAGNOSIS — E782 Mixed hyperlipidemia: Secondary | ICD-10-CM | POA: Diagnosis not present

## 2024-03-30 MED ORDER — ZEPBOUND 5 MG/0.5ML ~~LOC~~ SOAJ: 5.0000 mg | SUBCUTANEOUS | 0 refills | Status: AC

## 2024-03-30 NOTE — Progress Notes (Signed)
" [facility] [address] [place], [place] [address]  Internal MEDICINE  Office Visit Note  Patient Name: Joy Barrett  [id number]  [id number]  Date of Service: 03/30/2024   Complaints/HPI Pt is here for establishment of PCP. Chief Complaint  Patient presents with   New Patient (Initial Visit)    Weight loss     HPI Joy Barrett presents for a new patient visit to establish care.  Well-appearing 29 y.o. female with amenorrhea, obesity and history of iron deficiency Significant FH: none  Work: engineer, structural for [employer]: live at home with husband Diet: healthy Exercise: walking daily.  Tobacco use: none  Alcohol use: none  Illicit drug use: none  Pap smear: due now, has never had a pap smear---OVERDUE Labs: due for routine labs  New or worsening pain: none  Stopped OCP in December of 2024, and had 3-4 months of regular cycle and then has not had a period since April 2025. Has done pregnancy tests at home during this period of time and they were negative.  Has been on wegovy since November 2025 and has lost 25 lbs total now. Has been using an online website/pharmacy, wants to see a provider in person for weight management. Currently on 1 mg dose but had had issues with nausea and diarrhea. She would like to switch to zepbound  which her insurance said they would cover if she tried wegovy first.  Due for annual physical exam as well.    Current Medication: Outpatient Encounter Medications as of 03/30/2024  Medication Sig   tirzepatide  (ZEPBOUND ) 5 MG/0.5ML Pen Inject 5 mg into the skin once a week.   cetirizine (ZYRTEC) 10 MG tablet Take 10 mg by mouth daily. (Patient not taking: Reported on 03/30/2024)   hyoscyamine  (LEVSIN , ANASPAZ ) 0.125 MG tablet Take 2 tablets (0.25 mg total) by mouth every 4 (four) hours as needed. (Patient not taking: Reported on 03/30/2024)   imipramine  (TOFRANIL ) 10 MG tablet Take 1 tablet (10 mg total) by mouth at bedtime. (Patient  not taking: Reported on 03/30/2024)   [DISCONTINUED] Norgestimate-Ethinyl Estradiol Triphasic (TRI-LO-SPRINTEC) 0.18/0.215/0.25 MG-25 MCG tab Take 1 tablet by mouth daily. (Patient not taking: Reported on 03/30/2024)   No facility-administered encounter medications on file as of 03/30/2024.    Surgical History: History reviewed. No pertinent surgical history.  Medical History: Past Medical History:  Diagnosis Date   No known health problems     Family History: Family History  Problem Relation Age of Onset   Rheum arthritis Mother    Colon polyps Father     Social History   Socioeconomic History   Marital status: Married    Spouse name: Not on file   Number of children: Not on file   Years of education: Not on file   Highest education level: Not on file  Occupational History   Not on file  Tobacco Use   Smoking status: Never   Smokeless tobacco: Never  Vaping Use   Vaping status: Never Used  Substance and Sexual Activity   Alcohol use: No   Drug use: No   Sexual activity: Yes  Other Topics Concern   Not on file  Social History Narrative   Not on file   Social Drivers of Health   Tobacco Use: Low Risk (03/30/2024)   Patient History    Smoking Tobacco Use: Never    Smokeless Tobacco Use: Never    Passive Exposure: Not on file  Financial Resource Strain: Not on  file  Food Insecurity: Not on file  Transportation Needs: Not on file  Physical Activity: Not on file  Stress: Not on file  Social Connections: Not on file  Intimate Partner Violence: Not on file  Depression (PHQ2-9): Low Risk (03/30/2024)   Depression (PHQ2-9)    PHQ-2 Score: 0  Alcohol Screen: Not on file  Housing: Not on file  Utilities: Not on file  Health Literacy: Not on file     Review of Systems  Constitutional:  Positive for unexpected weight change. Negative for activity change, appetite change, chills and fatigue.  HENT: Negative.    Respiratory: Negative.  Negative for cough, chest  tightness, shortness of breath and wheezing.   Cardiovascular: Negative.  Negative for chest pain and palpitations.  Gastrointestinal: Negative.   Genitourinary:  Positive for vaginal bleeding (amenorrhea x10 months). Negative for dysuria.  Musculoskeletal: Negative.   Neurological: Negative.     Vital Signs: BP 130/88   Pulse 100   Temp 97.7 F (36.5 C)   Resp 16   Ht 5' 3 (1.6 m)   Wt 269 lb 3.2 oz (122.1 kg)   SpO2 99%   BMI 47.69 kg/m    Physical Exam Vitals reviewed.  Constitutional:      General: She is not in acute distress.    Appearance: Normal appearance. She is obese. She is not ill-appearing.  HENT:     Head: Normocephalic and atraumatic.  Eyes:     Pupils: Pupils are equal, round, and reactive to light.  Cardiovascular:     Rate and Rhythm: Normal rate and regular rhythm.  Pulmonary:     Effort: Pulmonary effort is normal. No respiratory distress.  Skin:    General: Skin is warm and dry.     Capillary Refill: Capillary refill takes less than 2 seconds.  Neurological:     Mental Status: She is alert and oriented to person, place, and time.  Psychiatric:        Mood and Affect: Mood normal.        Behavior: Behavior normal.       Assessment/Plan: 1. Amenorrhea (Primary) Routine and additional labs ordered  - tirzepatide  (ZEPBOUND ) 5 MG/0.5ML Pen; Inject 5 mg into the skin once a week.  Dispense: 2 mL; Refill: 0 - CBC with Differential/Platelet - CMP14+EGFR - Lipid Profile - FSH/LH - Estradiol - 17-Hydroxyprogesterone - Progesterone - DHEA-sulfate - Prolactin - Beta HCG, Quant - TSH + free T4 - Vitamin D (25 hydroxy) - B12 and Folate Panel - Iron, TIBC and Ferritin Panel - Testosterone,Free and Total  2. Mixed hyperlipidemia Routine and additional labs ordered  - CBC with Differential/Platelet - CMP14+EGFR - Lipid Profile - FSH/LH - Estradiol - 17-Hydroxyprogesterone - Progesterone - DHEA-sulfate - Prolactin - Beta HCG, Quant -  TSH + free T4 - Vitamin D (25 hydroxy) - B12 and Folate Panel - Iron, TIBC and Ferritin Panel - Testosterone,Free and Total  3. B12 deficiency Routine and additional labs ordered  - CBC with Differential/Platelet - CMP14+EGFR - Lipid Profile - FSH/LH - Estradiol - 17-Hydroxyprogesterone - Progesterone - DHEA-sulfate - Prolactin - Beta HCG, Quant - TSH + free T4 - Vitamin D (25 hydroxy) - B12 and Folate Panel - Iron, TIBC and Ferritin Panel - Testosterone,Free and Total  4. Vitamin D deficiency Routine and additional labs ordered  - CBC with Differential/Platelet - CMP14+EGFR - Lipid Profile - FSH/LH - Estradiol - 17-Hydroxyprogesterone - Progesterone - DHEA-sulfate - Prolactin - Beta HCG, Quant -  TSH + free T4 - Vitamin D (25 hydroxy) - B12 and Folate Panel - Iron, TIBC and Ferritin Panel - Testosterone,Free and Total  5. Morbid obesity with BMI of 45.0-49.9, adult (HCC) Discontinue wegovy  - tirzepatide  (ZEPBOUND ) 5 MG/0.5ML Pen; Inject 5 mg into the skin once a week.  Dispense: 2 mL; Refill: 0  6. History of iron deficiency anemia Routine and additional labs ordered  - CBC with Differential/Platelet - CMP14+EGFR - Lipid Profile - FSH/LH - Estradiol - 17-Hydroxyprogesterone - Progesterone - DHEA-sulfate - Prolactin - Beta HCG, Quant - TSH + free T4 - Vitamin D (25 hydroxy) - B12 and Folate Panel - Iron, TIBC and Ferritin Panel - Testosterone,Free and Total    General Counseling: Srinika verbalizes understanding of the findings of todays visit and agrees with plan of treatment. I have discussed any further diagnostic evaluation that may be needed or ordered today. We also reviewed her medications today. she has been encouraged to call the office with any questions or concerns that should arise related to todays visit.    Orders Placed This Encounter  Procedures   CBC with Differential/Platelet   CMP14+EGFR   Lipid Profile   FSH/LH    Estradiol   17-Hydroxyprogesterone   Progesterone   DHEA-sulfate   Prolactin   Beta HCG, Quant   TSH + free T4   Vitamin D (25 hydroxy)   B12 and Folate Panel   Iron, TIBC and Ferritin Panel   Testosterone,Free and Total    Meds ordered this encounter  Medications   tirzepatide  (ZEPBOUND ) 5 MG/0.5ML Pen    Sig: Inject 5 mg into the skin once a week.    Dispense:  2 mL    Refill:  0    Fill new script today. Discontinue wegovy.    Return in about 1 month (around 04/30/2024) for CPE/PAP, Gennett Garcia PCP and have labs done prior to visit. .  Time spent: 30 Minutes Time spent with patient included reviewing progress notes, labs, imaging studies, and discussing plan for follow up.   [place] Controlled Substance Database was reviewed by me for overdose risk score (ORS)   This patient was seen by Mardy Maxin, FNP-C in collaboration with Dr. Sigrid Bathe as a part of collaborative care agreement.   Bob Daversa R. Maxin, MSN, FNP-C Internal Medicine "

## 2024-04-13 ENCOUNTER — Telehealth: Payer: Self-pay

## 2024-04-13 NOTE — Telephone Encounter (Signed)
 Patient notified that her zepbound  approved.

## 2024-05-04 ENCOUNTER — Other Ambulatory Visit: Admitting: Nurse Practitioner
